# Patient Record
Sex: Male | Born: 1995 | Race: Black or African American | Hispanic: No | Marital: Single | Smoking: Never smoker
Health system: Southern US, Community
[De-identification: ages and names within clinical notes are randomized; demographics above are authoritative.]

---

## 2016-07-24 ENCOUNTER — Ambulatory Visit (HOSPITAL_COMMUNITY)
Admission: EM | Admit: 2016-07-24 | Discharge: 2016-07-24 | Disposition: A | Discharge status: Home / Self Care | Payer: Managed Care, Other (non HMO) | Attending: Internal Medicine | Admitting: Internal Medicine

## 2016-07-24 ENCOUNTER — Encounter (HOSPITAL_COMMUNITY): Payer: Self-pay | Admitting: Emergency Medicine

## 2016-07-24 DIAGNOSIS — K529 Noninfective gastroenteritis and colitis, unspecified: Secondary | ICD-10-CM | POA: Diagnosis not present

## 2016-07-24 DIAGNOSIS — R1115 Cyclical vomiting syndrome unrelated to migraine: Secondary | ICD-10-CM

## 2016-07-24 MED ORDER — ONDANSETRON 4 MG PO TBDP: 4.0000 mg | ORAL_TABLET | Freq: Three times a day (TID) | ORAL | 0 refills | Status: DC | PRN

## 2016-07-24 NOTE — ED Provider Notes (Signed)
CSN: [id number]     Arrival date & time 07/24/16  1306 History   None    Chief Complaint  Patient presents with  . Abdominal Pain   (Consider location/radiation/quality/duration/timing/severity/associated sxs/prior Treatment) Patient c/o NV that started last night   The history is provided by the patient.  Abdominal Pain  Pain location:  Generalized Associated symptoms: nausea     History reviewed. No pertinent past medical history. History reviewed. No pertinent surgical history. History reviewed. No pertinent family history. Social History  Substance Use Topics  . Smoking status: Never Smoker  . Smokeless tobacco: Never Used  . Alcohol use No    Review of Systems  Constitutional: Negative.   HENT: Negative.   Eyes: Negative.   Respiratory: Negative.   Cardiovascular: Negative.   Gastrointestinal: Positive for abdominal pain and nausea.  Endocrine: Negative.   Genitourinary: Negative.   Musculoskeletal: Negative.   Allergic/Immunologic: Negative.   Neurological: Negative.   Hematological: Negative.   Psychiatric/Behavioral: Negative.     Allergies  Patient has no known allergies.  Home Medications   Prior to Admission medications   Medication Sig Start Date End Date Taking? Authorizing Provider  ondansetron (ZOFRAN ODT) 4 MG disintegrating tablet Take 1 tablet (4 mg total) by mouth every 8 (eight) hours as needed for nausea or vomiting. 07/24/16   Deatra Canterxford, Kieron Kantner J, FNP   Meds Ordered and Administered this Visit  Medications - No data to display  BP 127/74 (BP Location: Left Arm)   Pulse 72   Temp 99.3 F (37.4 C) (Oral)   Resp 16   SpO2 100%  No data found.   Physical Exam  Constitutional: He is oriented to person, place, and time. He appears well-developed and well-nourished.  HENT:  Head: Normocephalic and atraumatic.  Eyes: Conjunctivae and EOM are normal. Pupils are equal, round, and reactive to light.  Neck: Normal range of motion. Neck  supple.  Cardiovascular: Normal rate, regular rhythm and normal heart sounds.   Pulmonary/Chest: Effort normal and breath sounds normal.  Abdominal: Soft. Bowel sounds are normal.  Musculoskeletal: Normal range of motion.  Neurological: He is alert and oriented to person, place, and time.  Nursing note and vitals reviewed.   Urgent Care Course     Procedures (including critical care time)  Labs Review Labs Reviewed - No data to display  Imaging Review No results found.   Visual Acuity Review  Right Eye Distance:   Left Eye Distance:   Bilateral Distance:    Right Eye Near:   Left Eye Near:    Bilateral Near:         MDM   1. Noninfectious gastroenteritis, unspecified type   2. Non-intractable cyclical vomiting with nausea    Zofran  Push po fluids, rest, tylenol and motrin otc prn as directed for fever, arthralgias, and myalgias.  Follow up prn if sx's continue or persist.    Deatra CanterOxford, Tala Eber J, [facility]NP 07/24/16 2118

## 2016-07-24 NOTE — ED Triage Notes (Signed)
[facility] a [facility]/v that started last night.

## 2016-07-28 ENCOUNTER — Emergency Department (HOSPITAL_COMMUNITY)
Admission: EM | Admit: 2016-07-28 | Discharge: 2016-07-28 | Disposition: A | Discharge status: Home / Self Care | Payer: Managed Care, Other (non HMO) | Attending: Emergency Medicine | Admitting: Emergency Medicine

## 2016-07-28 ENCOUNTER — Encounter (HOSPITAL_COMMUNITY): Payer: Self-pay

## 2016-07-28 ENCOUNTER — Emergency Department (HOSPITAL_COMMUNITY): Payer: Managed Care, Other (non HMO)

## 2016-07-28 DIAGNOSIS — R1010 Upper abdominal pain, unspecified: Secondary | ICD-10-CM | POA: Insufficient documentation

## 2016-07-28 DIAGNOSIS — R112 Nausea with vomiting, unspecified: Secondary | ICD-10-CM | POA: Diagnosis not present

## 2016-07-28 LAB — COMPREHENSIVE METABOLIC PANEL
ALT: 34 U/L (ref 17–63)
ANION GAP: 9 (ref 5–15)
AST: 42 U/L — ABNORMAL HIGH (ref 15–41)
Albumin: 4.3 g/dL (ref 3.5–5.0)
Alkaline Phosphatase: 56 U/L (ref 38–126)
BUN: 8 mg/dL (ref 6–20)
CO2: 26 mmol/L (ref 22–32)
Calcium: 9 mg/dL (ref 8.9–10.3)
Chloride: 103 mmol/L (ref 101–111)
Creatinine, Ser: 0.86 mg/dL (ref 0.61–1.24)
GFR calc Af Amer: >60 mL/min (ref >60)
GFR calc non Af Amer: >60 mL/min (ref >60)
GLUCOSE: 82 mg/dL (ref 65–99)
POTASSIUM: 4 mmol/L (ref 3.5–5.1)
SODIUM: 138 mmol/L (ref 135–145)
TOTAL PROTEIN: 7.5 g/dL (ref 6.5–8.1)
Total Bilirubin: 0.9 mg/dL (ref 0.3–1.2)

## 2016-07-28 LAB — CBC
HEMATOCRIT: 41 % (ref 39.0–52.0)
HEMOGLOBIN: 14.2 g/dL (ref 13.0–17.0)
MCH: 30 pg (ref 26.0–34.0)
MCHC: 34.6 g/dL (ref 30.0–36.0)
MCV: 86.5 fL (ref 78.0–100.0)
Platelets: 146 10*3/uL — ABNORMAL LOW (ref 150–400)
RBC: 4.74 MIL/uL (ref 4.22–5.81)
RDW: 13.6 % (ref 11.5–15.5)
WBC: 4.7 10*3/uL (ref 4.0–10.5)

## 2016-07-28 LAB — URINALYSIS, ROUTINE W REFLEX MICROSCOPIC
BILIRUBIN URINE: NEGATIVE
Glucose, UA: NEGATIVE mg/dL
Hgb urine dipstick: NEGATIVE
Ketones, ur: NEGATIVE mg/dL
Leukocytes, UA: NEGATIVE
NITRITE: NEGATIVE
PH: 8 (ref 5.0–8.0)
Protein, ur: NEGATIVE mg/dL
SPECIFIC GRAVITY, URINE: 1.01 (ref 1.005–1.030)

## 2016-07-28 LAB — LIPASE, BLOOD: Lipase: 34 U/L (ref 11–51)

## 2016-07-28 MED ORDER — IOPAMIDOL (ISOVUE-300) INJECTION 61%
INTRAVENOUS | Status: AC
  Administered 2016-07-28: 100 mL
  Filled 2016-07-28: qty 100

## 2016-07-28 MED ORDER — MORPHINE SULFATE (PF) 4 MG/ML IV SOLN
4.0000 mg | Freq: Once | INTRAVENOUS | Status: AC
  Administered 2016-07-28: 4 mg via INTRAVENOUS
  Filled 2016-07-28: qty 1

## 2016-07-28 MED ORDER — LEVOFLOXACIN 500 MG PO TABS: 500.0000 mg | ORAL_TABLET | Freq: Every day | ORAL | 0 refills | Status: AC

## 2016-07-28 MED ORDER — ONDANSETRON 8 MG PO TBDP: 8.0000 mg | ORAL_TABLET | Freq: Three times a day (TID) | ORAL | 0 refills | Status: AC | PRN

## 2016-07-28 MED ORDER — SODIUM CHLORIDE 0.9 % IV BOLUS (SEPSIS)
1000.0000 mL | Freq: Once | INTRAVENOUS | Status: AC
  Administered 2016-07-28: 1000 mL via INTRAVENOUS

## 2016-07-28 MED ORDER — LEVOFLOXACIN 500 MG PO TABS
500.0000 mg | ORAL_TABLET | Freq: Once | ORAL | Status: AC
  Administered 2016-07-28: 500 mg via ORAL
  Filled 2016-07-28: qty 1

## 2016-07-28 NOTE — ED Notes (Signed)
Pt transported to CT ?

## 2016-07-28 NOTE — ED Triage Notes (Signed)
Pt reports upper abdominal pain x one week associated with n/v/d

## 2016-07-28 NOTE — ED Notes (Signed)
Pt verbalized understanding of d/c instructions and has no further questions. Pt stable and NAD. VSS.  

## 2016-07-28 NOTE — ED Provider Notes (Signed)
[facility]-EMERGENCY DEPT Provider Note   CSN: [id number] Arrival date & time: 07/28/16  1725     History   Chief Complaint Chief Complaint  Patient presents with  . Abdominal Pain    HPI Miguel Oliver is a [age] y.o. male.  HPI Patient is a 21 year old male who reports upper abdominal discomfort and cough over the past 5-6 days.  He initially had nausea vomiting diarrhea but that is since resolved but his upper abdominal discomfort and cough have continued.  No shortness of breath.  No productive cough.  No fevers or chills.  Denies blood in the stool.  No blood in his vomit.  Patient several had discomfort or pain like this before.  Symptoms are moderate in severity.   History reviewed. No pertinent past medical history.  There are no active problems to display for this patient.   History reviewed. No pertinent surgical history.     Home Medications    Prior to Admission medications   Medication Sig Start Date End Date Taking? Authorizing Provider  charcoal activated, NO SORBITOL, (ACTIDOSE-AQUA) suspension Take 100 g by mouth once.   Yes [provider]  ondansetron (ZOFRAN ODT) 4 MG disintegrating tablet Take 1 tablet (4 mg total) by mouth every 8 (eight) hours as needed for nausea or vomiting. 07/24/16  Yes Deatra Canterxford, William J, FNP    Family History No family history on file.  Social History Social History  Substance Use Topics  . Smoking status: Never Smoker  . Smokeless tobacco: Never Used  . Alcohol use No     Allergies   Patient has no known allergies.   Review of Systems Review of Systems  All other systems reviewed and are negative.    Physical Exam Updated Vital Signs BP 126/83   Pulse 63   Temp 98.2 F (36.8 C) (Oral)   Resp 16   SpO2 100%   Physical Exam  Constitutional: He is oriented to person, place, and time.  HENT:  Head: Normocephalic and atraumatic.  Eyes: EOM are normal.  Neck: Normal range of motion.  Cardiovascular:  Normal rate, regular rhythm and intact distal pulses.   Pulmonary/Chest: Effort normal and breath sounds normal. No respiratory distress.  Abdominal: He exhibits no distension.  Mild upper abdominal tenderness without guarding or rebound  Musculoskeletal: Normal range of motion.  Neurological: He is alert and oriented to person, place, and time.  Skin: Skin is warm and dry.  Nursing note and vitals reviewed.    ED Treatments / Results  Labs (all labs ordered are listed, but only abnormal results are displayed) Labs Reviewed  COMPREHENSIVE METABOLIC PANEL - Abnormal; Notable for the following:       Result Value   AST 42 (*)    All other components within normal limits  CBC - Abnormal; Notable for the following:    Platelets 146 (*)    All other components within normal limits  LIPASE, BLOOD  URINALYSIS, ROUTINE W REFLEX MICROSCOPIC    EKG  EKG Interpretation None       Radiology Ct Abdomen Pelvis W Contrast  Result Date: 07/28/2016 CLINICAL DATA:  21 year old male with generalized abdominal pain, nausea vomiting and diarrhea. EXAM: CT ABDOMEN AND PELVIS WITH CONTRAST TECHNIQUE: Multidetector CT imaging of the abdomen and pelvis was performed using the standard protocol following bolus administration of intravenous contrast. CONTRAST:  100mL ISOVUE-300 IOPAMIDOL (ISOVUE-300) INJECTION 61% COMPARISON:  None. FINDINGS: Lower chest: Focal area of hazy density at the right lung  base most consistent with pneumonia. Clinical correlation and follow-up resolution recommended. No intra-abdominal free air. There is a small amount of free fluid within the pelvis. Hepatobiliary: The liver is unremarkable. No intrahepatic biliary ductal dilatation. There is mild periportal edema. The gallbladder is unremarkable. Pancreas: Unremarkable. No pancreatic ductal dilatation or surrounding inflammatory changes. Spleen: Normal in size without focal abnormality. Adrenals/Urinary Tract: Adrenal glands are  unremarkable. Kidneys are normal, without renal calculi, focal lesion, or hydronephrosis. Bladder is unremarkable. Stomach/Bowel: There is moderate stool throughout the colon. No evidence of bowel obstruction or active inflammation. Normal appendix. Vascular/Lymphatic: No significant vascular findings are present. No enlarged abdominal or pelvic lymph nodes. Reproductive: The prostate and seminal vesicles are grossly unremarkable. Other: None Musculoskeletal: No acute or significant osseous findings. IMPRESSION: 1. Focal right lung base opacity concerning for pneumonia. Clinical correlation and follow-up to resolution recommended. 2. Mild periportal edema. Correlation with clinical exam and LFTs recommended. 3. No bowel obstruction or active inflammation.  Normal appendix. 4. Trace free fluid within the posterior pelvis of indeterminate etiology and clinical significance. Electronically Signed   By: Elgie Collard M.D.   On: 07/28/2016 21:20    Procedures Procedures (including critical care time)  Medications Ordered in ED Medications  levofloxacin (LEVAQUIN) tablet 500 mg (not administered)  morphine 4 MG/ML injection 4 mg (4 mg Intravenous Given 07/28/16 2006)  sodium chloride 0.9 % bolus 1,000 mL (1,000 mLs Intravenous New Bag/Given 07/28/16 2006)  iopamidol (ISOVUE-300) 61 % injection (100 mLs  Contrast Given 07/28/16 2104)     Initial Impression / Assessment and Plan / ED Course  I have reviewed the triage vital signs and the nursing notes.  Pertinent labs & imaging results that were available during my care of the patient were reviewed by me and considered in my medical decision making (see chart for details).     Questionable pneumonia on examination.  CT abdomen pelvis otherwise normal.  Patient be started on Levaquin.  Repeat abdominal exam without tenderness.  Patient feels much better at this time.  Primary care follow-up  Final Clinical Impressions(s) / ED Diagnoses   Final  diagnoses:  Upper abdominal pain  Nausea and vomiting, intractability of vomiting not specified, unspecified vomiting type    New Prescriptions New Prescriptions   No medications on file     Azalia Bilis, MD 07/28/16 2147

## 2019-02-13 IMAGING — CT CT ABD-PELV W/ CM
2 of 4 series · 16 of 46 positions shown, 18 images · IV contrast (iopamidol)
Comparison: None.

CLINICAL DATA: 21-year-old male with generalized abdominal pain,
nausea vomiting and diarrhea.

EXAM:
CT ABDOMEN AND PELVIS WITH CONTRAST
TECHNIQUE: Multidetector CT imaging of the abdomen and pelvis was performed
using the standard protocol following bolus administration of
intravenous contrast.
CONTRAST:  100mL 4ZCAY3-H77 IOPAMIDOL (4ZCAY3-H77) INJECTION 61%

[Series 3: abd/ pelvis 5.0 i30f 2 · axial · 0.65mm/px · z∈[+996,+1376]mm · 13 of 84 slices shown, 15 images]
[im 4/84  soft-tissue]
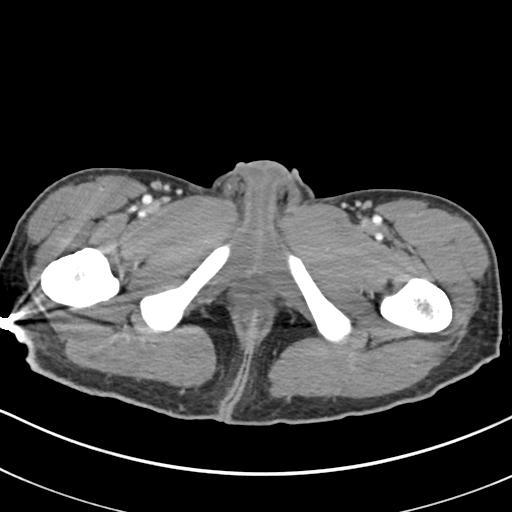
[im 4/84  bone]
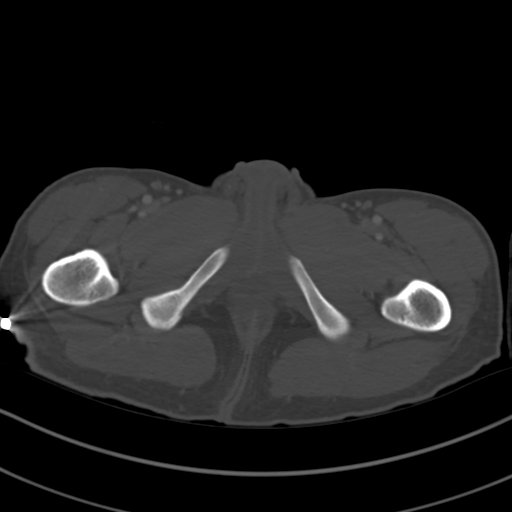
[im 11/84  soft-tissue]
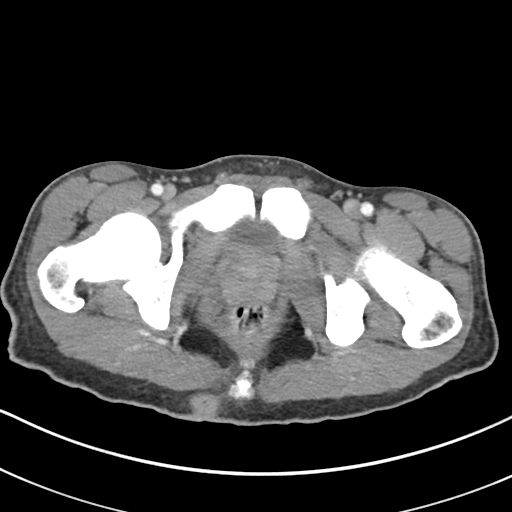
[im 18/84  soft-tissue]
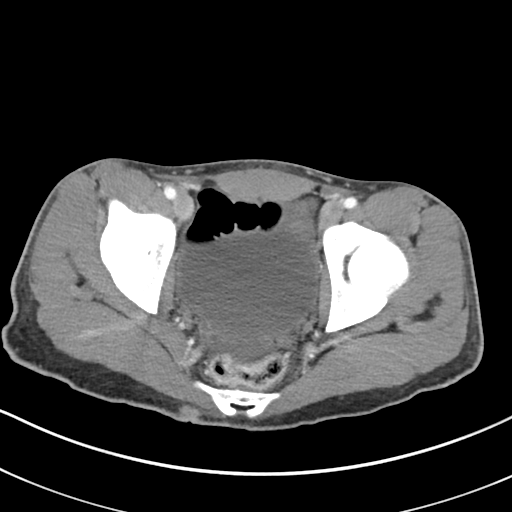
[im 25/84  soft-tissue]
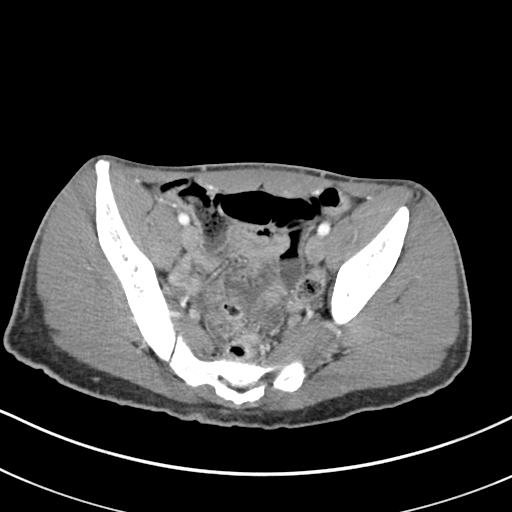
[im 28/84  soft-tissue]
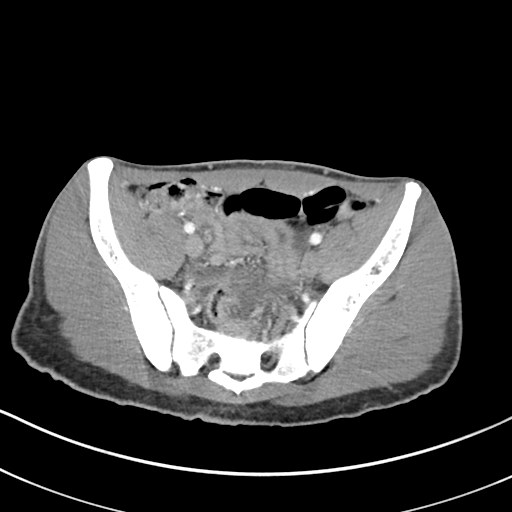
[im 35/84  soft-tissue]
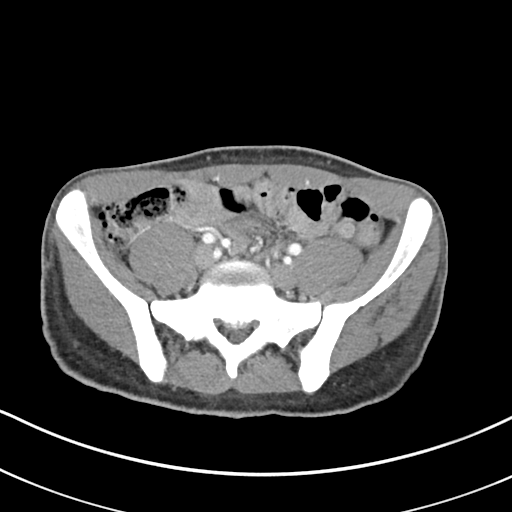
[im 42/84  soft-tissue]
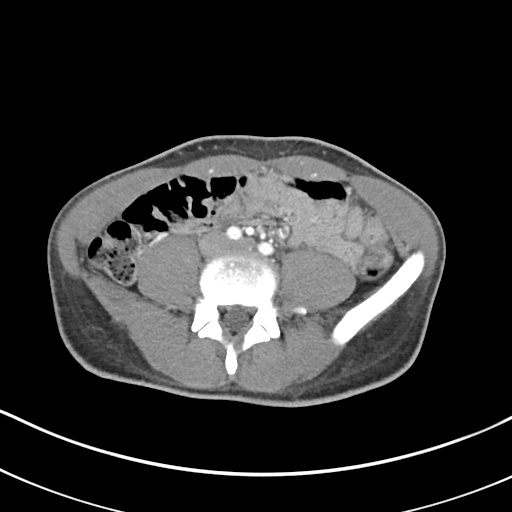
[im 49/84  soft-tissue]
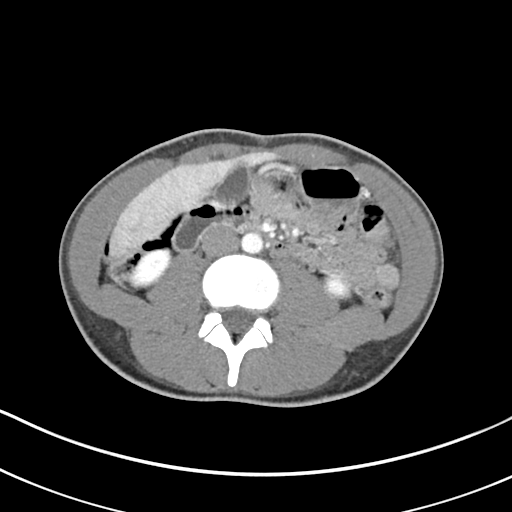
[im 56/84  soft-tissue]
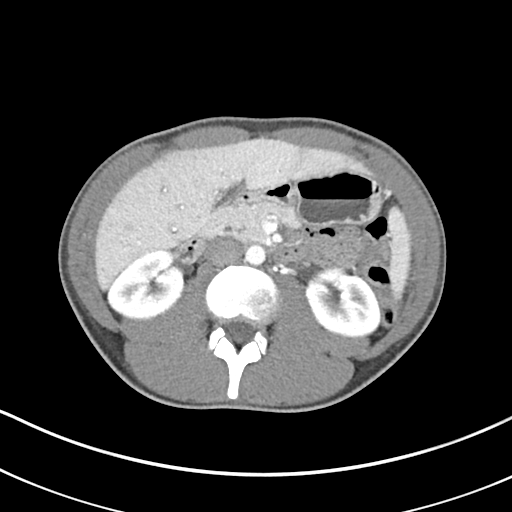
[im 56/84  bone]
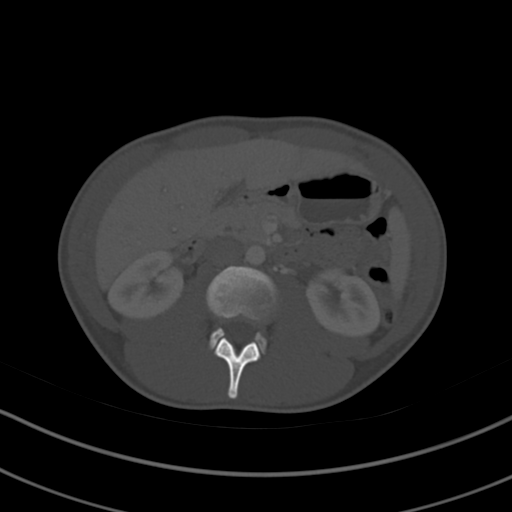
[im 59/84  soft-tissue]
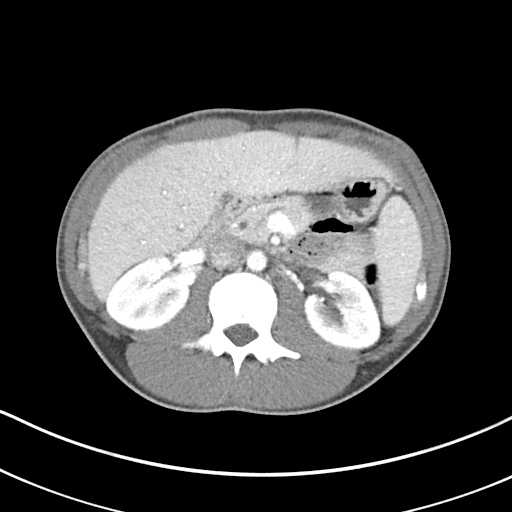
[im 66/84  soft-tissue]
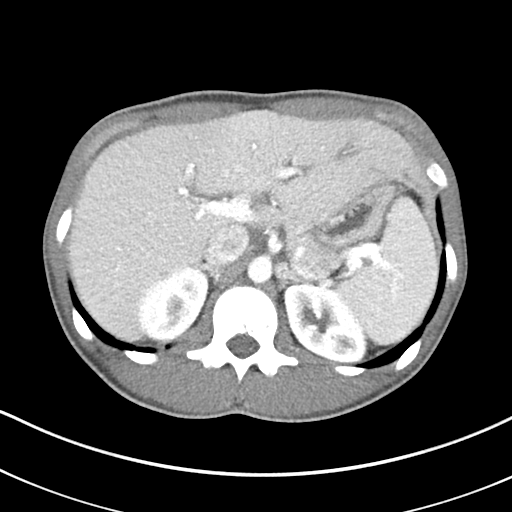
[im 73/84  soft-tissue]
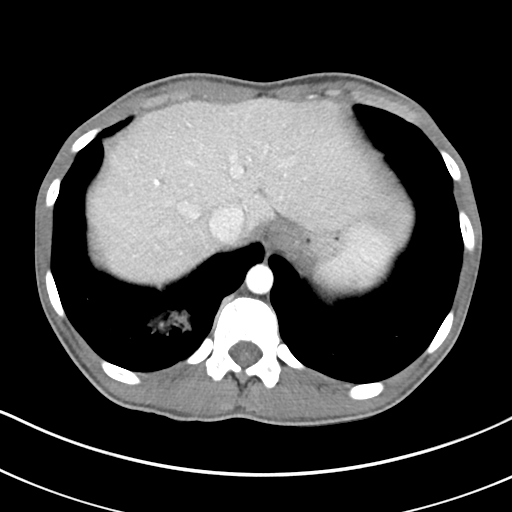
[im 80/84  soft-tissue]
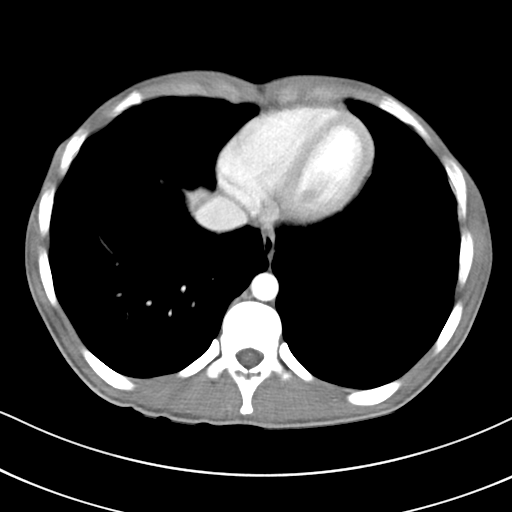

[Series 6: coronal soft tissue · coronal · 0.69mm/px · 3 of 82 slices shown]
[im 28/82  soft-tissue]
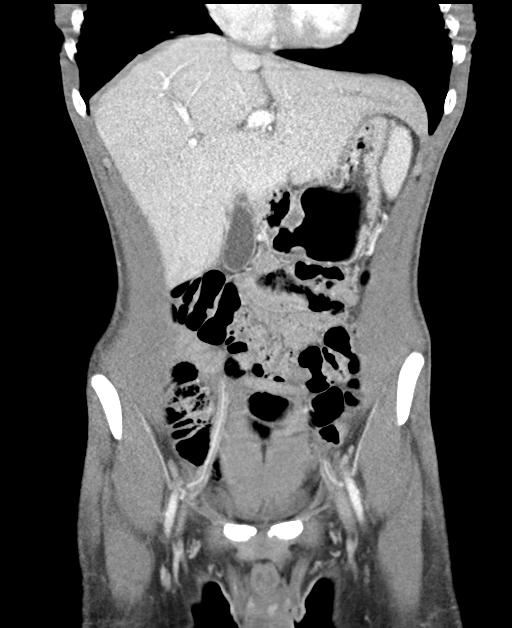
[im 37/82  soft-tissue]
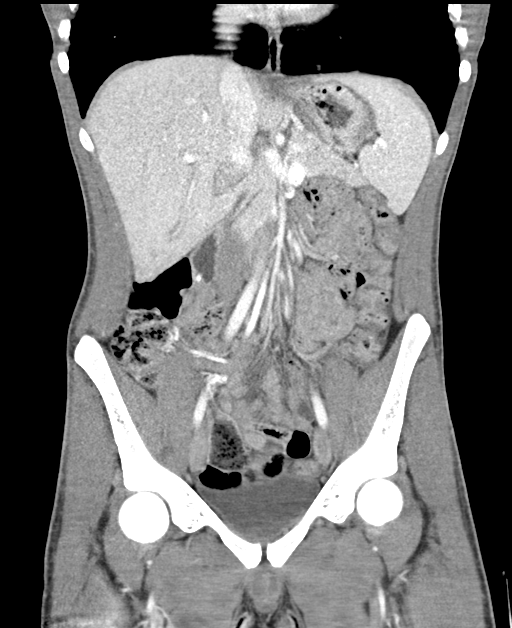
[im 46/82  soft-tissue]
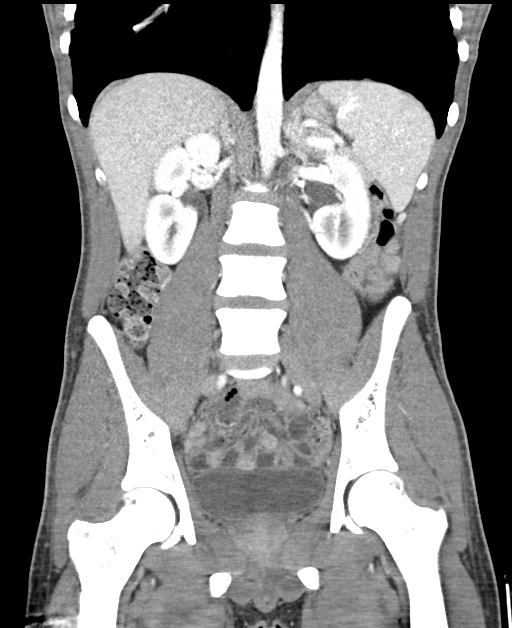

[16 of 46 positions shown; findings below may reference images not displayed]

FINDINGS: Lower chest: Focal area of hazy density at the right lung base most
consistent with pneumonia. Clinical correlation and follow-up
resolution recommended.

No intra-abdominal free air. There is a small amount of free fluid
within the pelvis.

Hepatobiliary: The liver is unremarkable. No intrahepatic biliary
ductal dilatation. There is mild periportal edema. The gallbladder
is unremarkable.

Pancreas: Unremarkable. No pancreatic ductal dilatation or
surrounding inflammatory changes.

Spleen: Normal in size without focal abnormality.

Adrenals/Urinary Tract: Adrenal glands are unremarkable. Kidneys are
normal, without renal calculi, focal lesion, or hydronephrosis.
Bladder is unremarkable.

Stomach/Bowel: There is moderate stool throughout the colon. No
evidence of bowel obstruction or active inflammation. Normal
appendix.

Vascular/Lymphatic: No significant vascular findings are present. No
enlarged abdominal or pelvic lymph nodes.

Reproductive: The prostate and seminal vesicles are grossly
unremarkable.

Other: None

Musculoskeletal: No acute or significant osseous findings.
IMPRESSION: 1. Focal right lung base opacity concerning for pneumonia. Clinical
correlation and follow-up to resolution recommended.
2. Mild periportal edema. Correlation with clinical exam and LFTs
recommended.
3. No bowel obstruction or active inflammation.  Normal appendix.
4. Trace free fluid within the posterior pelvis of indeterminate
etiology and clinical significance.
